# Patient Record
Sex: Male | Born: 1945 | Race: Black or African American | Hispanic: No | Marital: Married
Health system: Southern US, Community
[De-identification: ages and names within clinical notes are randomized; demographics above are authoritative.]

## PROBLEM LIST (undated history)

## (undated) DIAGNOSIS — G309 Alzheimer's disease, unspecified: Secondary | ICD-10-CM

## (undated) DIAGNOSIS — I10 Essential (primary) hypertension: Secondary | ICD-10-CM

## (undated) DIAGNOSIS — E119 Type 2 diabetes mellitus without complications: Secondary | ICD-10-CM

## (undated) DIAGNOSIS — F0280 Dementia in other diseases classified elsewhere without behavioral disturbance: Secondary | ICD-10-CM

## (undated) DIAGNOSIS — F319 Bipolar disorder, unspecified: Secondary | ICD-10-CM

---

## 2000-01-07 ENCOUNTER — Other Ambulatory Visit: Admission: RE | Admit: 2000-01-07 | Discharge: 2000-01-16 | Payer: Self-pay | Admitting: Psychiatry

## 2003-07-03 ENCOUNTER — Encounter: Payer: Self-pay | Admitting: Endocrinology

## 2003-07-03 ENCOUNTER — Ambulatory Visit (HOSPITAL_COMMUNITY): Admission: RE | Admit: 2003-07-03 | Discharge: 2003-07-03 | Payer: Self-pay | Admitting: Endocrinology

## 2004-02-21 ENCOUNTER — Encounter: Admission: RE | Admit: 2004-02-21 | Discharge: 2004-02-21 | Payer: Self-pay | Admitting: *Deleted

## 2004-10-03 ENCOUNTER — Ambulatory Visit: Payer: Self-pay | Admitting: Endocrinology

## 2004-10-31 ENCOUNTER — Ambulatory Visit: Payer: Self-pay | Admitting: Endocrinology

## 2004-11-14 ENCOUNTER — Ambulatory Visit: Payer: Self-pay | Admitting: Endocrinology

## 2004-12-11 ENCOUNTER — Ambulatory Visit: Payer: Self-pay | Admitting: Endocrinology

## 2005-01-15 ENCOUNTER — Ambulatory Visit: Payer: Self-pay | Admitting: Endocrinology

## 2005-02-16 ENCOUNTER — Ambulatory Visit: Payer: Self-pay | Admitting: Endocrinology

## 2006-02-02 ENCOUNTER — Ambulatory Visit: Payer: Self-pay | Admitting: Endocrinology

## 2006-02-10 ENCOUNTER — Ambulatory Visit: Payer: Self-pay | Admitting: Endocrinology

## 2006-02-13 ENCOUNTER — Emergency Department (HOSPITAL_COMMUNITY): Admission: EM | Admit: 2006-02-13 | Discharge: 2006-02-13 | Payer: Self-pay | Admitting: Emergency Medicine

## 2006-03-19 ENCOUNTER — Emergency Department (HOSPITAL_COMMUNITY): Admission: EM | Admit: 2006-03-19 | Discharge: 2006-03-19 | Payer: Self-pay | Admitting: Emergency Medicine

## 2006-04-01 ENCOUNTER — Emergency Department (HOSPITAL_COMMUNITY): Admission: EM | Admit: 2006-04-01 | Discharge: 2006-04-02 | Payer: Self-pay | Admitting: Emergency Medicine

## 2007-01-12 ENCOUNTER — Ambulatory Visit: Payer: Self-pay | Admitting: Endocrinology

## 2007-04-18 ENCOUNTER — Ambulatory Visit: Payer: Self-pay | Admitting: Endocrinology

## 2007-04-18 LAB — CONVERTED CEMR LAB
ALT: 15 units/L (ref 0–53)
AST: 26 units/L (ref 0–37)
Albumin: 3.8 g/dL (ref 3.5–5.2)
Alkaline Phosphatase: 55 units/L (ref 39–117)
BUN: 10 mg/dL (ref 6–23)
Bacteria, UA: NEGATIVE
Basophils Absolute: 0 10*3/uL (ref 0.0–0.1)
Basophils Relative: 0.4 % (ref 0.0–1.0)
Bilirubin Urine: NEGATIVE
Bilirubin, Direct: 0.2 mg/dL (ref 0.0–0.3)
CO2: 29 meq/L (ref 19–32)
Calcium: 9.1 mg/dL (ref 8.4–10.5)
Chloride: 106 meq/L (ref 96–112)
Cholesterol: 172 mg/dL (ref 0–200)
Creatinine, Ser: 0.8 mg/dL (ref 0.4–1.5)
Creatinine,U: 311.8 mg/dL
Crystals: NEGATIVE
Eosinophils Absolute: 0.1 10*3/uL (ref 0.0–0.6)
Eosinophils Relative: 1.3 % (ref 0.0–5.0)
GFR calc Af Amer: 126 mL/min
GFR calc non Af Amer: 104 mL/min
Glucose, Bld: 164 mg/dL — ABNORMAL HIGH (ref 70–99)
HCT: 44.3 % (ref 39.0–52.0)
HDL: 103.9 mg/dL (ref >39.0)
Hemoglobin, Urine: NEGATIVE
Hemoglobin: 15.4 g/dL (ref 13.0–17.0)
Hgb A1c MFr Bld: 6.3 % — ABNORMAL HIGH (ref 4.6–6.0)
Ketones, ur: NEGATIVE mg/dL
LDL Cholesterol: 55 mg/dL (ref 0–99)
Leukocytes, UA: NEGATIVE
Lymphocytes Relative: 27.3 % (ref 12.0–46.0)
MCHC: 34.8 g/dL (ref 30.0–36.0)
MCV: 94.9 fL (ref 78.0–100.0)
Microalb Creat Ratio: 12.8 mg/g (ref 0.0–30.0)
Microalb, Ur: 4 mg/dL — ABNORMAL HIGH (ref 0.0–1.9)
Monocytes Absolute: 0.6 10*3/uL (ref 0.2–0.7)
Monocytes Relative: 8.6 % (ref 3.0–11.0)
Neutro Abs: 4.3 10*3/uL (ref 1.4–7.7)
Neutrophils Relative %: 62.4 % (ref 43.0–77.0)
Nitrite: NEGATIVE
PSA: 0.67 ng/mL (ref 0.10–4.00)
Platelets: 181 10*3/uL (ref 150–400)
Potassium: 3.6 meq/L (ref 3.5–5.1)
RBC / HPF: NONE SEEN
RBC: 4.67 M/uL (ref 4.22–5.81)
RDW: 12 % (ref 11.5–14.6)
Sodium: 143 meq/L (ref 135–145)
Specific Gravity, Urine: 1.025 (ref 1.000–1.03)
TSH: 1.56 microintl units/mL (ref 0.35–5.50)
Testosterone: 266.44 ng/dL — ABNORMAL LOW (ref 350.00–890)
Total Bilirubin: 1 mg/dL (ref 0.3–1.2)
Total CHOL/HDL Ratio: 1.7
Total Protein, Urine: 30 mg/dL — AB
Total Protein: 7.1 g/dL (ref 6.0–8.3)
Triglycerides: 65 mg/dL (ref 0–149)
Urine Glucose: NEGATIVE mg/dL
Urobilinogen, UA: 0.2 (ref 0.0–1.0)
VLDL: 13 mg/dL (ref 0–40)
WBC: 6.9 10*3/uL (ref 4.5–10.5)
pH: 6 (ref 5.0–8.0)

## 2007-04-26 ENCOUNTER — Encounter: Payer: Self-pay | Admitting: Endocrinology

## 2007-04-26 DIAGNOSIS — F3289 Other specified depressive episodes: Secondary | ICD-10-CM | POA: Insufficient documentation

## 2007-04-26 DIAGNOSIS — E119 Type 2 diabetes mellitus without complications: Secondary | ICD-10-CM

## 2007-04-26 DIAGNOSIS — F411 Generalized anxiety disorder: Secondary | ICD-10-CM | POA: Insufficient documentation

## 2007-04-26 DIAGNOSIS — F329 Major depressive disorder, single episode, unspecified: Secondary | ICD-10-CM

## 2007-04-27 ENCOUNTER — Ambulatory Visit: Payer: Self-pay | Admitting: Endocrinology

## 2007-04-29 ENCOUNTER — Ambulatory Visit: Payer: Self-pay | Admitting: Endocrinology

## 2007-05-23 ENCOUNTER — Ambulatory Visit: Payer: Self-pay | Admitting: Endocrinology

## 2007-07-01 ENCOUNTER — Encounter: Payer: Self-pay | Admitting: Endocrinology

## 2007-09-11 ENCOUNTER — Emergency Department (HOSPITAL_COMMUNITY): Admission: EM | Admit: 2007-09-11 | Discharge: 2007-09-11 | Payer: Self-pay | Admitting: Emergency Medicine

## 2007-09-12 ENCOUNTER — Ambulatory Visit: Payer: Self-pay | Admitting: Endocrinology

## 2007-09-12 DIAGNOSIS — IMO0002 Reserved for concepts with insufficient information to code with codable children: Secondary | ICD-10-CM | POA: Insufficient documentation

## 2007-09-12 DIAGNOSIS — I10 Essential (primary) hypertension: Secondary | ICD-10-CM | POA: Insufficient documentation

## 2007-09-12 LAB — CONVERTED CEMR LAB
BUN: 14 mg/dL (ref 6–23)
CO2: 29 meq/L (ref 19–32)
Calcium: 9.2 mg/dL (ref 8.4–10.5)
Chloride: 104 meq/L (ref 96–112)
Creatinine, Ser: 0.9 mg/dL (ref 0.4–1.5)
GFR calc Af Amer: 110 mL/min
GFR calc non Af Amer: 91 mL/min
Glucose, Bld: 147 mg/dL — ABNORMAL HIGH (ref 70–99)
Potassium: 3.8 meq/L (ref 3.5–5.1)
Sodium: 141 meq/L (ref 135–145)

## 2007-09-13 ENCOUNTER — Telehealth (INDEPENDENT_AMBULATORY_CARE_PROVIDER_SITE_OTHER): Payer: Self-pay | Admitting: *Deleted

## 2007-09-14 ENCOUNTER — Encounter: Admission: RE | Admit: 2007-09-14 | Discharge: 2007-09-14 | Payer: Self-pay | Admitting: Endocrinology

## 2007-09-19 ENCOUNTER — Telehealth (INDEPENDENT_AMBULATORY_CARE_PROVIDER_SITE_OTHER): Payer: Self-pay | Admitting: *Deleted

## 2007-09-21 ENCOUNTER — Encounter: Payer: Self-pay | Admitting: Endocrinology

## 2007-09-21 ENCOUNTER — Telehealth (INDEPENDENT_AMBULATORY_CARE_PROVIDER_SITE_OTHER): Payer: Self-pay | Admitting: *Deleted

## 2007-11-02 ENCOUNTER — Telehealth (INDEPENDENT_AMBULATORY_CARE_PROVIDER_SITE_OTHER): Payer: Self-pay | Admitting: *Deleted

## 2007-11-04 ENCOUNTER — Telehealth (INDEPENDENT_AMBULATORY_CARE_PROVIDER_SITE_OTHER): Payer: Self-pay | Admitting: *Deleted

## 2007-11-21 ENCOUNTER — Telehealth (INDEPENDENT_AMBULATORY_CARE_PROVIDER_SITE_OTHER): Payer: Self-pay | Admitting: *Deleted

## 2007-12-27 ENCOUNTER — Ambulatory Visit: Payer: Self-pay | Admitting: Internal Medicine

## 2007-12-27 DIAGNOSIS — J069 Acute upper respiratory infection, unspecified: Secondary | ICD-10-CM | POA: Insufficient documentation

## 2007-12-27 DIAGNOSIS — J019 Acute sinusitis, unspecified: Secondary | ICD-10-CM

## 2008-02-10 ENCOUNTER — Ambulatory Visit: Payer: Self-pay | Admitting: Endocrinology

## 2008-02-24 ENCOUNTER — Encounter: Payer: Self-pay | Admitting: Endocrinology

## 2008-03-01 ENCOUNTER — Encounter: Admission: RE | Admit: 2008-03-01 | Discharge: 2008-03-01 | Payer: Self-pay | Admitting: Neurology

## 2008-03-01 ENCOUNTER — Ambulatory Visit: Payer: Self-pay | Admitting: Endocrinology

## 2008-03-01 DIAGNOSIS — F0390 Unspecified dementia without behavioral disturbance: Secondary | ICD-10-CM

## 2008-03-05 ENCOUNTER — Encounter: Admission: RE | Admit: 2008-03-05 | Discharge: 2008-03-05 | Payer: Self-pay | Admitting: Neurology

## 2008-03-05 ENCOUNTER — Encounter: Payer: Self-pay | Admitting: Endocrinology

## 2008-03-06 ENCOUNTER — Telehealth (INDEPENDENT_AMBULATORY_CARE_PROVIDER_SITE_OTHER): Payer: Self-pay | Admitting: *Deleted

## 2008-03-07 ENCOUNTER — Encounter: Payer: Self-pay | Admitting: Endocrinology

## 2008-04-18 ENCOUNTER — Ambulatory Visit: Payer: Self-pay | Admitting: Endocrinology

## 2008-04-21 LAB — CONVERTED CEMR LAB
ALT: 26 units/L (ref 0–53)
Basophils Absolute: 0.1 10*3/uL (ref 0.0–0.1)
Basophils Relative: 1 % (ref 0.0–3.0)
Bilirubin, Direct: 0.1 mg/dL (ref 0.0–0.3)
Calcium: 9.1 mg/dL (ref 8.4–10.5)
Cholesterol: 156 mg/dL (ref 0–200)
Creatinine, Ser: 0.9 mg/dL (ref 0.4–1.5)
Creatinine,U: 314.9 mg/dL
GFR calc Af Amer: 110 mL/min
Glucose, Bld: 145 mg/dL — ABNORMAL HIGH (ref 70–99)
HCT: 49.2 % (ref 39.0–52.0)
HDL: 87.1 mg/dL (ref >39.0)
Hemoglobin: 17.3 g/dL — ABNORMAL HIGH (ref 13.0–17.0)
Ketones, ur: NEGATIVE mg/dL
MCHC: 35.1 g/dL (ref 30.0–36.0)
Microalb Creat Ratio: 19.4 mg/g (ref 0.0–30.0)
Microalb, Ur: 6.1 mg/dL — ABNORMAL HIGH (ref 0.0–1.9)
Monocytes Absolute: 0.6 10*3/uL (ref 0.1–1.0)
Neutro Abs: 4.3 10*3/uL (ref 1.4–7.7)
RBC: 5.07 M/uL (ref 4.22–5.81)
RDW: 12.1 % (ref 11.5–14.6)
Sodium: 139 meq/L (ref 135–145)
Specific Gravity, Urine: >=1.03 (ref 1.000–1.03)
Testosterone: 786.32 ng/dL (ref 350.00–890)
Total Bilirubin: 0.8 mg/dL (ref 0.3–1.2)
Total Protein: 7.1 g/dL (ref 6.0–8.3)
Triglycerides: 45 mg/dL (ref 0–149)
Urine Glucose: NEGATIVE mg/dL
pH: 5.5 (ref 5.0–8.0)

## 2008-04-26 ENCOUNTER — Ambulatory Visit: Payer: Self-pay | Admitting: Endocrinology

## 2008-04-26 DIAGNOSIS — K769 Liver disease, unspecified: Secondary | ICD-10-CM | POA: Insufficient documentation

## 2008-04-26 DIAGNOSIS — B009 Herpesviral infection, unspecified: Secondary | ICD-10-CM | POA: Insufficient documentation

## 2008-04-26 DIAGNOSIS — E291 Testicular hypofunction: Secondary | ICD-10-CM | POA: Insufficient documentation

## 2008-04-30 ENCOUNTER — Telehealth: Payer: Self-pay | Admitting: Endocrinology

## 2008-08-16 ENCOUNTER — Telehealth (INDEPENDENT_AMBULATORY_CARE_PROVIDER_SITE_OTHER): Payer: Self-pay | Admitting: *Deleted

## 2009-02-22 ENCOUNTER — Ambulatory Visit: Payer: Self-pay | Admitting: Endocrinology

## 2009-02-22 DIAGNOSIS — N476 Balanoposthitis: Secondary | ICD-10-CM | POA: Insufficient documentation

## 2010-10-12 LAB — CONVERTED CEMR LAB: HCV Ab: NEGATIVE

## 2014-04-03 ENCOUNTER — Encounter (HOSPITAL_COMMUNITY): Payer: Self-pay | Admitting: Emergency Medicine

## 2014-04-03 ENCOUNTER — Emergency Department (HOSPITAL_COMMUNITY)
Admission: EM | Admit: 2014-04-03 | Discharge: 2014-04-04 | Disposition: A | Discharge status: Home / Self Care | Payer: Medicare Other | Attending: Emergency Medicine | Admitting: Emergency Medicine

## 2014-04-03 DIAGNOSIS — G20A1 Parkinson's disease without dyskinesia, without mention of fluctuations: Secondary | ICD-10-CM | POA: Insufficient documentation

## 2014-04-03 DIAGNOSIS — Z880 Allergy status to penicillin: Secondary | ICD-10-CM | POA: Diagnosis not present

## 2014-04-03 DIAGNOSIS — F23 Brief psychotic disorder: Secondary | ICD-10-CM | POA: Diagnosis not present

## 2014-04-03 DIAGNOSIS — F411 Generalized anxiety disorder: Secondary | ICD-10-CM | POA: Diagnosis not present

## 2014-04-03 DIAGNOSIS — F22 Delusional disorders: Secondary | ICD-10-CM | POA: Diagnosis not present

## 2014-04-03 DIAGNOSIS — G20 Parkinson's disease: Secondary | ICD-10-CM | POA: Insufficient documentation

## 2014-04-03 DIAGNOSIS — F29 Unspecified psychosis not due to a substance or known physiological condition: Secondary | ICD-10-CM

## 2014-04-03 DIAGNOSIS — Z046 Encounter for general psychiatric examination, requested by authority: Secondary | ICD-10-CM | POA: Diagnosis present

## 2014-04-03 DIAGNOSIS — F28 Other psychotic disorder not due to a substance or known physiological condition: Secondary | ICD-10-CM

## 2014-04-03 HISTORY — DX: Alzheimer's disease, unspecified: G30.9

## 2014-04-03 HISTORY — DX: Essential (primary) hypertension: I10

## 2014-04-03 HISTORY — DX: Dementia in other diseases classified elsewhere, unspecified severity, without behavioral disturbance, psychotic disturbance, mood disturbance, and anxiety: F02.80

## 2014-04-03 HISTORY — DX: Bipolar disorder, unspecified: F31.9

## 2014-04-03 HISTORY — DX: Type 2 diabetes mellitus without complications: E11.9

## 2014-04-03 LAB — URINALYSIS, ROUTINE W REFLEX MICROSCOPIC
Bilirubin Urine: NEGATIVE
Glucose, UA: NEGATIVE mg/dL
HGB URINE DIPSTICK: NEGATIVE
KETONES UR: NEGATIVE mg/dL
Leukocytes, UA: NEGATIVE
Nitrite: NEGATIVE
PROTEIN: NEGATIVE mg/dL
Specific Gravity, Urine: 1.009 (ref 1.005–1.030)
Urobilinogen, UA: 0.2 mg/dL (ref 0.0–1.0)
pH: 7 (ref 5.0–8.0)

## 2014-04-03 LAB — RAPID URINE DRUG SCREEN, HOSP PERFORMED
Amphetamines: NOT DETECTED
Barbiturates: NOT DETECTED
Benzodiazepines: NOT DETECTED
Cocaine: NOT DETECTED
OPIATES: NOT DETECTED
TETRAHYDROCANNABINOL: NOT DETECTED

## 2014-04-03 LAB — COMPREHENSIVE METABOLIC PANEL
ALT: 15 U/L (ref 0–53)
AST: 31 U/L (ref 0–37)
Albumin: 3.9 g/dL (ref 3.5–5.2)
Alkaline Phosphatase: 79 U/L (ref 39–117)
Anion gap: 14 (ref 5–15)
BUN: 6 mg/dL (ref 6–23)
CALCIUM: 9.3 mg/dL (ref 8.4–10.5)
CO2: 22 meq/L (ref 19–32)
CREATININE: 0.75 mg/dL (ref 0.50–1.35)
Chloride: 101 mEq/L (ref 96–112)
GFR calc Af Amer: >90 mL/min (ref >90)
GFR calc non Af Amer: >90 mL/min (ref >90)
Glucose, Bld: 134 mg/dL — ABNORMAL HIGH (ref 70–99)
Potassium: 3.7 mEq/L (ref 3.7–5.3)
Sodium: 137 mEq/L (ref 137–147)
Total Bilirubin: 0.6 mg/dL (ref 0.3–1.2)
Total Protein: 7.3 g/dL (ref 6.0–8.3)

## 2014-04-03 LAB — CBC WITH DIFFERENTIAL/PLATELET
Basophils Absolute: 0 10*3/uL (ref 0.0–0.1)
Basophils Relative: 1 % (ref 0–1)
EOS PCT: 1 % (ref 0–5)
Eosinophils Absolute: 0 10*3/uL (ref 0.0–0.7)
HEMATOCRIT: 43.3 % (ref 39.0–52.0)
Hemoglobin: 15.4 g/dL (ref 13.0–17.0)
LYMPHS ABS: 2.1 10*3/uL (ref 0.7–4.0)
LYMPHS PCT: 26 % (ref 12–46)
MCH: 31.6 pg (ref 26.0–34.0)
MCHC: 35.6 g/dL (ref 30.0–36.0)
MCV: 88.9 fL (ref 78.0–100.0)
MONO ABS: 0.4 10*3/uL (ref 0.1–1.0)
MONOS PCT: 6 % (ref 3–12)
Neutro Abs: 5.3 10*3/uL (ref 1.7–7.7)
Neutrophils Relative %: 66 % (ref 43–77)
Platelets: 192 10*3/uL (ref 150–400)
RBC: 4.87 MIL/uL (ref 4.22–5.81)
RDW: 11.6 % (ref 11.5–15.5)
WBC: 7.9 10*3/uL (ref 4.0–10.5)

## 2014-04-03 LAB — ETHANOL

## 2014-04-03 MED ORDER — ACETAMINOPHEN 325 MG PO TABS: 650.0000 mg | ORAL_TABLET | ORAL | Status: DC | PRN

## 2014-04-03 MED ORDER — IBUPROFEN 200 MG PO TABS: 600.0000 mg | ORAL_TABLET | Freq: Three times a day (TID) | ORAL | Status: DC | PRN

## 2014-04-03 MED ORDER — ONDANSETRON HCL 4 MG PO TABS: 4.0000 mg | ORAL_TABLET | Freq: Three times a day (TID) | ORAL | Status: DC | PRN

## 2014-04-03 MED ORDER — LORAZEPAM 1 MG PO TABS
1.0000 mg | ORAL_TABLET | Freq: Three times a day (TID) | ORAL | Status: DC | PRN
  Administered 2014-04-03 – 2014-04-04 (×2): 1 mg via ORAL
  Filled 2014-04-03 (×2): qty 1

## 2014-04-03 MED ORDER — ALUM & MAG HYDROXIDE-SIMETH 200-200-20 MG/5ML PO SUSP: 30.0000 mL | ORAL | Status: DC | PRN

## 2014-04-03 NOTE — ED Notes (Addendum)
Pt changed into blue paper scrubs.  Pt has black shorts, black boxer/briefs, red collar t-shirt, black short sleeve shirt. Items placed in belonging bag at yellow side nurse's desk, waiting for security to wand pt and belongings.

## 2014-04-03 NOTE — Consult Note (Signed)
[facility] Consult   Reason for Consult:  Paranoid thinking Referring Physician:  ER MD  Todd Howe is an 68 y.o. male. Total Time spent with patient: 45 minutes  Assessment: AXIS I:  Psychotic Disorder NOS AXIS II:  Deferred AXIS III:   Past Medical History  Diagnosis Date  . Bipolar affective   . Alzheimer disease   . Hypertension   . Diabetes mellitus without complication     resolved, not currently being treated as of 02/2014   AXIS IV:  other psychosocial or environmental problems AXIS V:  51-60 moderate symptoms  Plan:  Recommend psychiatric Inpatient admission when medically cleared.  Subjective:   Todd Howe is a 68 y.o. male patient admitted with paranoid thinking .  HPI:  Todd Howe says he is fine and there are no problems.  His daughter reported he has been paranoid recently thinking people are trying to kill him.  He had a similar episode 3 years ago.  When he gets this way he isolates himself from his family as he is doing now.  He is in the process of getting a divorce and moved here from [place] in May.  He does believe people are trying to kill him including his wife he reported.  He was at the police station yesterday saying his computer had been hacked and that people were trying to kill him..  Today he was apparently lying on the floor of a business because he was tired.  He is talkative and does not display any dementia symptoms at this point. HPI Elements:   Location:  paranoid thinking. Quality:  believes people are trying to kill him. Severity:  as above. Timing:  in the process of getting a divorce he does not want. Duration:  week or so. Context:  as above.  Past Psychiatric History: Past Medical History  Diagnosis Date  . Bipolar affective   . Alzheimer disease   . Hypertension   . Diabetes mellitus without complication     resolved, not currently being treated as of 02/2014    reports that he does not drink alcohol or use  illicit drugs. His tobacco history is not on file. No family history on file.         Allergies:   Allergies  Allergen Reactions  . Penicillins     REACTION: unspecified    ACT Assessment Complete:  Yes:    Educational Status    Risk to Self: Risk to self Is patient at risk for suicide?: No Substance abuse history and/or treatment for substance abuse?: No  Risk to Others:    Abuse:    Prior Inpatient Therapy:    Prior Outpatient Therapy:    Additional Information:                    Objective: Blood pressure 137/91, pulse 78, temperature 98.1 F (36.7 C), temperature source Oral, resp. rate 16, SpO2 98.00%.There is no weight on file to calculate BMI. Results for orders placed during the hospital encounter of 04/03/14 (from the past 72 hour(s))  CBC WITH DIFFERENTIAL     Status: None   Collection Time    04/03/14 11:42 AM      Result Value Ref Range   WBC 7.9  4.0 - 10.5 K/uL   RBC 4.87  4.22 - 5.81 MIL/uL   Hemoglobin 15.4  13.0 - 17.0 g/dL   HCT 43.3  39.0 - 52.0 %   MCV 88.9  78.0 - 100.0 fL   MCH 31.6  26.0 - 34.0 pg   MCHC 35.6  30.0 - 36.0 g/dL   RDW 11.6  11.5 - 15.5 %   Platelets 192  150 - 400 K/uL   Neutrophils Relative % 66  43 - 77 %   Neutro Abs 5.3  1.7 - 7.7 K/uL   Lymphocytes Relative 26  12 - 46 %   Lymphs Abs 2.1  0.7 - 4.0 K/uL   Monocytes Relative 6  3 - 12 %   Monocytes Absolute 0.4  0.1 - 1.0 K/uL   Eosinophils Relative 1  0 - 5 %   Eosinophils Absolute 0.0  0.0 - 0.7 K/uL   Basophils Relative 1  0 - 1 %   Basophils Absolute 0.0  0.0 - 0.1 K/uL  COMPREHENSIVE METABOLIC PANEL     Status: Abnormal   Collection Time    04/03/14 11:42 AM      Result Value Ref Range   Sodium 137  137 - 147 mEq/L   Potassium 3.7  3.7 - 5.3 mEq/L   Chloride 101  96 - 112 mEq/L   CO2 22  19 - 32 mEq/L   Glucose, Bld 134 (*) 70 - 99 mg/dL   BUN 6  6 - 23 mg/dL   Creatinine, Ser 0.75  0.50 - 1.35 mg/dL   Calcium 9.3  8.4 - 10.5 mg/dL   Total  Protein 7.3  6.0 - 8.3 g/dL   Albumin 3.9  3.5 - 5.2 g/dL   AST 31  0 - 37 U/L   ALT 15  0 - 53 U/L   Alkaline Phosphatase 79  39 - 117 U/L   Total Bilirubin 0.6  0.3 - 1.2 mg/dL   GFR calc non Af Amer >90  >90 mL/min   GFR calc Af Amer >90  >90 mL/min   Comment: (NOTE)     The eGFR has been calculated using the CKD EPI equation.     This calculation has not been validated in all clinical situations.     eGFR's persistently <90 mL/min signify possible Chronic Kidney     Disease.   Anion gap 14  5 - 15  ETHANOL     Status: None   Collection Time    04/03/14 11:42 AM      Result Value Ref Range   Alcohol, Ethyl (B) <11  0 - 11 mg/dL   Comment:            LOWEST DETECTABLE LIMIT FOR     SERUM ALCOHOL IS 11 mg/dL     FOR MEDICAL PURPOSES ONLY  URINALYSIS, ROUTINE W REFLEX MICROSCOPIC     Status: None   Collection Time    04/03/14 12:58 PM      Result Value Ref Range   Color, Urine YELLOW  YELLOW   APPearance CLEAR  CLEAR   Specific Gravity, Urine 1.009  1.005 - 1.030   pH 7.0  5.0 - 8.0   Glucose, UA NEGATIVE  NEGATIVE mg/dL   Hgb urine dipstick NEGATIVE  NEGATIVE   Bilirubin Urine NEGATIVE  NEGATIVE   Ketones, ur NEGATIVE  NEGATIVE mg/dL   Protein, ur NEGATIVE  NEGATIVE mg/dL   Urobilinogen, UA 0.2  0.0 - 1.0 mg/dL   Nitrite NEGATIVE  NEGATIVE   Leukocytes, UA NEGATIVE  NEGATIVE   Comment: MICROSCOPIC NOT DONE ON URINES WITH NEGATIVE PROTEIN, BLOOD, LEUKOCYTES, NITRITE, OR GLUCOSE <1000 mg/dL.  URINE RAPID DRUG  SCREEN (HOSP PERFORMED)     Status: None   Collection Time    04/03/14 12:58 PM      Result Value Ref Range   Opiates NONE DETECTED  NONE DETECTED   Cocaine NONE DETECTED  NONE DETECTED   Benzodiazepines NONE DETECTED  NONE DETECTED   Amphetamines NONE DETECTED  NONE DETECTED   Tetrahydrocannabinol NONE DETECTED  NONE DETECTED   Barbiturates NONE DETECTED  NONE DETECTED   Comment:            DRUG SCREEN FOR MEDICAL PURPOSES     ONLY.  IF CONFIRMATION IS NEEDED      FOR ANY PURPOSE, NOTIFY LAB     WITHIN 5 DAYS.                LOWEST DETECTABLE LIMITS     FOR URINE DRUG SCREEN     Drug Class       Cutoff (ng/mL)     Amphetamine      1000     Barbiturate      200     Benzodiazepine   952     Tricyclics       841     Opiates          300     Cocaine          300     THC              50   Labs are reviewed and are pertinent for no psychiatric issues.  No current facility-administered medications for this encounter.   Current Outpatient Prescriptions  Medication Sig Dispense Refill  . amLODipine (NORVASC) 5 MG tablet Take 5 mg by mouth daily.      Marland Kitchen aspirin EC 81 MG tablet Take 162 mg by mouth daily.      . Cholecalciferol (VITAMIN D3) 5000 UNITS CAPS Take 5,000 Units by mouth daily.      . clobetasol cream (TEMOVATE) 3.24 % Apply 1 application topically 2 (two) times daily as needed (rash).      . metFORMIN (GLUCOPHAGE) 500 MG tablet Take 500 mg by mouth 2 (two) times daily with a meal.      . Omega-3 Fatty Acids (FISH OIL) 1000 MG CAPS Take 2,000 mg by mouth daily.      Marland Kitchen testosterone (ANDROGEL) 50 MG/5GM (1%) GEL Place 5 g onto the skin daily.      . traZODone (DESYREL) 150 MG tablet Take 150 mg by mouth at bedtime as needed for sleep.      . vitamin B-12 (CYANOCOBALAMIN) 500 MCG tablet Take 500 mcg by mouth daily.      Marland Kitchen ketoconazole (NIZORAL) 2 % shampoo Apply 1 application topically 2 (two) times a week.      . triamcinolone cream (KENALOG) 0.1 % Apply 1 application topically 2 (two) times daily as needed (rash).        Psychiatric Specialty Exam:     Blood pressure 137/91, pulse 78, temperature 98.1 F (36.7 C), temperature source Oral, resp. rate 16, SpO2 98.00%.There is no weight on file to calculate BMI.  General Appearance: Well Groomed  Engineer, water::  Good  Speech:  Clear and Coherent  Volume:  Normal  Mood:  Euthymic  Affect:  Appropriate  Thought Process:  Coherent  Orientation:  Full (Time, Place, and Person)   Thought Content:  denies paranoid thoughts but does believe people are trying to kill him  Suicidal Thoughts:  No  Homicidal Thoughts:  No  Memory:  Immediate;   Good Recent;   Good Remote;   Good  Judgement:  Impaired  Insight:  Shallow  Psychomotor Activity:  Normal  Concentration:  Good  Recall:  Good  Fund of Knowledge:Good  Language: Good  Akathisia:  Negative  Handed:  Right  AIMS (if indicated):     Assets:  Communication Skills Housing Physical Health Social Support Transportation  Sleep:      Musculoskeletal: Strength & Muscle Tone: within normal limits Gait & Station: normal Patient leans: N/A  Treatment Plan Summary: Daily contact with patient to assess and evaluate symptoms and progress in treatment Medication management Seek inpatient bed for treatment of psychosis  Elston Aldape D 04/03/2014 3:17 PM

## 2014-04-03 NOTE — ED Notes (Signed)
Pt constantly standing near the door and looking out.  On occasion, pt will open door and yell in the hallway or bother other pt's.  Pt has been re-oriented back to his room multiple times. Dr. Criss AlvineGoldston made aware of his behavior and 1mg  PO ativan given to pt as reflected in the eMAR.

## 2014-04-03 NOTE — ED Notes (Signed)
Per Thayer Ohmhris and Tracy/children pt went to [employer] [facility] stating people trying to track his phone and computer. Pt not himself and off of medications. Pt appears agitated with family. Family in waiting room at present time.  PA and MD aware of ALL.

## 2014-04-03 NOTE — ED Notes (Signed)
Security at bedside wanding pt 

## 2014-04-03 NOTE — ED Notes (Signed)
TTS at bedside. 

## 2014-04-03 NOTE — BH Assessment (Signed)
[facility] Assessment Progress Note  [facility]. [facility] by Morrie SheldonAshley due to history of dementia in medical history.  [facility] no [facility].  TTS will continue seeking placement.

## 2014-04-03 NOTE — ED Notes (Signed)
Bed: [id number] Expected date: 04/03/14 Expected time: 10:47 AM Means of arrival:  Comments: ems psych hx male

## 2014-04-03 NOTE — Consult Note (Signed)
  Review of Systems  Constitutional: Negative.   HENT: Negative.   Eyes: Negative.   Respiratory: Negative.   Cardiovascular: Negative.   Gastrointestinal: Negative.   Genitourinary: Negative.   Musculoskeletal: Negative.   Skin: Negative.   Neurological: Negative.   Endo/Heme/Allergies: Negative.   Psychiatric/Behavioral: Negative.    Has paranoid thinking

## 2014-04-03 NOTE — ED Notes (Addendum)
Upon assessment pt reports he is Todd Howe. Pt unable to state birthday. Pt aware [employer] brought him here but unable to state why. Pt states does not feel like he slept and believes has been driving all night. Pt later states he is 68 years old. Pt reports it is Tuesday July 21st.   Pt denies SI/HI.

## 2014-04-03 NOTE — ED Notes (Addendum)
Black shorts, black underwear, red collared shirt, black shirt, black crocs, 2 verizon samsung cell phone, 4 one dollar bills, 1 fiver dollar bill, metal cross, keys, pepper spray, black comb, 3 white mints, and 1 cough drop IN ONE PT BELONGINGS BAG in LOCKER 26.

## 2014-04-03 NOTE — ED Notes (Addendum)
Todd Howe/friend of pt here at bedside and bought emergency list written by pt. Per Todd Howe from emergency list reports [facility] [address] [place] [place] with early stages of Alzheimer's dementia. Information from Todd Howe reports by [employer] who brought pt here.  Todd Howe/like a son/close friend [phone] Todd Howe/sister [phone] [facility]/friend [phone]

## 2014-04-03 NOTE — ED Notes (Signed)
Pt given ice water and saline crackers.

## 2014-04-03 NOTE — ED Notes (Addendum)
Pt reports correct name and birth date at present time. Pt reports knows he was calling himself Jill SideJudge David Right to tell a good story. Pt reports this morning was going to gym and was "redirected" and that "being redirected sometimes saves your life."

## 2014-04-03 NOTE — ED Notes (Addendum)
Per EMS pt at business and reports he was tired. Pt continued by laying on the floor. [employer] arrived on scene and pt transported here via EMS for psychiatric evaluation. Pt reports to EMS that he had a doctor appointment today.  EMS reports POA states extensive psych medical hx.

## 2014-04-03 NOTE — ED Notes (Signed)
Pt reports birth date of 06/03/2014. Pt states is Jill SideJudge David Right.

## 2014-04-03 NOTE — ED Provider Notes (Signed)
Medical screening examination/treatment/procedure(s) were conducted as a shared visit with non-physician practitioner(s) and myself.  I personally evaluated the patient during the encounter.   EKG Interpretation None      Pt is a 68 y.o. male with history of hypertension, diabetes, Alzheimer's dementia, bipolar disorder who presents to the emergency department in police custody for concerns for psychosis. Per police and EMS, patient was at a place of business today when he laid on the floor because he states he is tired. He initially told staff in the emergency department that his name was Heather RobertsJudge David Wright and he was disoriented to time.  When I evaluated the patient, he is oriented x3. When I asked him why he keeps them a different name, he states "because I was trying to act crazy because people are trying to kill me.". When asked who is training to him, patient states the people that should be in jail, they are trying to poison me." Patient denies any SI or HI. Denies drug or alcohol use. Patient's friend is at bedside he reports that he has been acting abnormally for the past 2 days and he does not think he is taking his medications. Patient appears psychotic, paranoid but is cooperative. We'll obtain screening labs and urine and consult psychiatry for evaluation.  Layla MawKristen N Abdelrahman Nair, DO 04/04/14 [phone]

## 2014-04-03 NOTE — ED Notes (Signed)
PA at bedside.

## 2014-04-03 NOTE — ED Provider Notes (Signed)
CSN: [id number]     Arrival date & time 04/03/14  1057 History   First MD Initiated Contact with Patient 04/03/14 1102     Chief Complaint  Patient presents with  . Psychiatric Evaluation     (Consider location/radiation/quality/duration/timing/severity/associated sxs/prior Treatment) HPI Comments: Patient brought in today by [employer] due to concern for Psychosis.  He apparently went to a place of business today and was lying on the ground.  [employer] was called and brought the patient to the ED.  Upon arrival in the ED, patient told Nursing Staff that he was Windy CannyJudge Wright.  He then told staff that his daughter is trying to poison him.  He reports that several people are trying to kill him.  He also reports that people are trying to track his phone.  Family report that the patient has a history of Bipolar and has been off of his medication for a couple of days.  Family also reports that the patient has a history of Early Alzheimers.  Patient denies SI or HI.  He denies any recent alcohol or recreational drug use.  He denies any physical symptoms at this time.  The history is provided by the patient.    No past medical history on file. No past surgical history on file. No family history on file. History  Substance Use Topics  . Smoking status: Not on file  . Smokeless tobacco: Not on file  . Alcohol Use: Not on file    Review of Systems  All other systems reviewed and are negative.     Allergies  Penicillins  Home Medications   Prior to Admission medications   Not on File   BP 150/95  Pulse 82  Temp(Src) 98.1 F (36.7 C) (Oral)  Resp 16  SpO2 99% Physical Exam  Nursing note reviewed. Constitutional: He appears well-developed and well-nourished.  HENT:  Head: Normocephalic and atraumatic.  Mouth/Throat: Oropharynx is clear and moist.  Eyes: EOM are normal. Pupils are equal, round, and reactive to light.  Neck: Normal range of motion. Neck supple.  Cardiovascular: Normal rate,  regular rhythm and normal heart sounds.   Pulmonary/Chest: Effort normal and breath sounds normal.  Musculoskeletal: Normal range of motion.  Neurological: He is alert. He has normal strength. No cranial nerve deficit or sensory deficit. Gait normal.  Skin: Skin is warm and dry.  Psychiatric: His mood appears anxious. His speech is tangential. Thought content is paranoid. He expresses no homicidal and no suicidal ideation. He expresses no suicidal plans and no homicidal plans.    ED Course  Procedures (including critical care time) Labs Review Labs Reviewed  CBC WITH DIFFERENTIAL  COMPREHENSIVE METABOLIC PANEL  URINALYSIS, ROUTINE W REFLEX MICROSCOPIC  URINE RAPID DRUG SCREEN (HOSP PERFORMED)  ETHANOL    Imaging Review No results found.   EKG Interpretation None      MDM   Final diagnoses:  None   Patient with a history of Bipolar brought in today by [employer] due to concern for Psychosis.  Family report that the patient has a history of the same and has not been taking his Psychiatric Medications for the past few days.  Patient appears to be Paranoid, but is cooperative.  Labs and UA unremarkable.  TTS consulted.  Placement pending.      Santiago GladHeather Keene Gilkey, PA-C 04/04/14 2236

## 2014-04-03 NOTE — ED Notes (Addendum)
Tonya/daughter called here and reports yesterday pt/father reports to close friend Michael/shierff "not making sense and people after room." Per daughter pt has not been taking his dementia medication and acted like this 3 years ago. At that time,pt was admitted for "chemical imbalance" but daughter unable to state due to what. Per daughter Thayer OhmChris and Tracy/other children on way.   Archie Pattenonya [phone]  Past medical hx per Tonya: high blood pressure, diabetes resolved currently not treated, bipolar, and dementia.

## 2014-04-04 ENCOUNTER — Emergency Department (HOSPITAL_COMMUNITY): Payer: Medicare Other

## 2014-04-04 LAB — CBG MONITORING, ED: GLUCOSE-CAPILLARY: 147 mg/dL — AB (ref 70–99)

## 2014-04-04 MED ORDER — OLANZAPINE 10 MG PO TBDP
10.0000 mg | ORAL_TABLET | Freq: Once | ORAL | Status: AC
  Administered 2014-04-04: 10 mg via ORAL
  Filled 2014-04-04: qty 1

## 2014-04-04 MED ORDER — OLANZAPINE 10 MG PO TBDP: 10.0000 mg | ORAL_TABLET | Freq: Every day | ORAL | Status: DC

## 2014-04-04 NOTE — ED Notes (Signed)
Pt given a pen and paper.

## 2014-04-04 NOTE — ED Notes (Addendum)
[facility], [facility]. Pt unable to sign for discharge s/t delusions.

## 2014-04-04 NOTE — Consult Note (Signed)
  Psychiatric Specialty Exam: Physical Exam  ROS  Blood pressure 140/82, pulse 79, temperature 97.5 F (36.4 C), temperature source Oral, resp. rate 16, SpO2 100.00%.There is no weight on file to calculate BMI.  General Appearance: Well Groomed  Patent attorneyye Contact::  Good  Speech:  rapid but not pressured  Volume:  Normal  Mood:  Euphoric  Affect:  Labile  Thought Process:  Coherent and Irrelevant  Orientation:  Full (Time, Place, and Person)  Thought Content:  Negative  Suicidal Thoughts:  No  Homicidal Thoughts:  No  Memory:  Immediate;   Good Recent;   Good Remote;   Good  Judgement:  Poor  Insight:  Shallow  Psychomotor Activity:  Normal  Concentration:  Good  Recall:  Good  Akathisia:  Negative  Handed:  Right  AIMS (if indicated):     Assets:  ArchitectCommunication Skills Financial Resources/Insurance Housing Physical Health  Sleep:   adequate  Todd Howe. Vague explanations for his behavior, for example when asked why he was lying on the floor of a business he said, "the police said I was, so I was". The nurse recorded a note he wrote threatening to kill others with other cryptic messages.  The plan is to seek inpatient treatment for bipolar disorder. He does not want to be inpatient but wants to stay in the ER for awhile because he feels safe from the people who he believes are trying to kill him.  He is annoying the other patients with his intrusive behavior, his nurse reported and will be given Zyprexa to see if that helps.

## 2014-04-04 NOTE — ED Provider Notes (Signed)
Pt brought to the ED for psychosis. Awaiting placement.  Standing up in his room, smiling without complaints.  Filed Vitals:   04/04/14 0908  BP: 140/82  Pulse: 79  Temp:   Resp: 16   Pending placement   Todd DibblesJon Beata Beason, MD 04/04/14 [phone]

## 2014-04-04 NOTE — Consult Note (Signed)
Review of Systems   Constitutional: Negative.    HENT: Negative.    Eyes: Negative.    Respiratory: Negative.    Cardiovascular: Negative.    Gastrointestinal: Negative.    Genitourinary: Negative.    Musculoskeletal: Negative.    Skin: Negative.    Neurological: Negative.    Endo/Heme/Allergies: Negative.    Psychiatric/Behavioral: Negative.

## 2014-04-04 NOTE — BH Assessment (Signed)
[facility] Assessment Progress Note  [facility] Dr. Jeannine KittenFarah per Albin Fellingarla.. Can be transported by police after 4:30.  Call report # is [phone].  Faxed demographic sheet with SS#.

## 2014-04-04 NOTE — ED Notes (Addendum)
Pt has hand-written the following on paper:   "Look Alike, Act Alike  A. Pay the Prices B. Rep the rewards C. Walk Out With Staplesracy.  The The First Daugher Only one Saved by -- [employer] [facility].  Motive - Greed REVENGE  Plan While in Prison ---------- Have Roetta SessionsJohn William Danielski SR, To Shannan HarperKill his entire family including his son - in Social workerlaw [place]. Why Sherrie Sportlarence, Mohannad Hated Clarence Because CHRIS felt that he was the Cause of his initial Incarceration. Darlin Drophristopher Layman Granade"

## 2014-04-04 NOTE — BH Assessment (Signed)
[facility] Assessment Progress Note  At 13:00 I received a call from TracyDana at [facility].  She reports that they will not have a bed available today, but are willing to accept pt once one is available.  However, they will need the following items:  *Findings and Custody Order *An EKG *A chest x-ray  After receiving them and a bed becomes available they will call with the accepting physician's name and other details.  At 13:03 I called Barrington EllisonEmily Hull, TS at the [facility] to notify her.  She agrees to obtain and send this information.  Doylene Canninghomas Laboy, MA Triage Specialist 04/04/2014 @ 13:06

## 2014-04-04 NOTE — Progress Notes (Signed)
Attempted to secure placement and faxed the following facilities:  [facility]- Victorino DikeJennifer- faxed information [facility]- Margarita RanaMarsia- faxed information [facility]- no answer [facility]- [facility]- faxed information [facility]- [facility]- faxed information [facility]- no answer [facility]- Carol- no beds [facility]- [facility] beds [facility]- Trey PaulaJeff- fax to the wait list [facility]- Dara LordsLesha- faxed information [facility]- Robin- no beds [facility]- Marylu LundJanet- faxed information [facility]- [facility]- only Child/adol [facility]- Dorathy DaftKayla- faxed information [facility]- Terry- faxed information [facility]- left message [facility]- [facility] message [facility]- Melody- [facility] beds [facility]- Maryann- [facility] beds [facility]- Christine- only [facility]/adol [facility]- Sharon- faxed information [facility]- faxed information    Maryelizabeth Rowanressa Airel Magadan, MSW, ElversonLCSWA, 04/04/2014 Evening Clinical Social Worker [phone]

## 2014-04-04 NOTE — ED Notes (Signed)
[facility] received [facility] on patent and they are at full capacity

## 2014-04-04 NOTE — ED Notes (Signed)
pts children  Todd Howe, daughter, [phone], lives in [place] Howe, son, [phone] Todd Howe, daughter, [phone]

## 2014-04-04 NOTE — BH Assessment (Signed)
[facility] Assessment Progress Note  At 16:42 I received a call from Trixie DredgeKaren Hassall at [facility].  Pt has been declined by their facility due to lack of appropriate programming for pt's with Alzheimer's dementia.  Doylene Canninghomas Confer, MA Triage Specialist 04/04/2014 @ 16:49

## 2015-01-24 IMAGING — CR DG CHEST 1V
1 series · 2 of 2 positions shown · non-contrast
Comparison: None.

CLINICAL DATA: Psychiatric evaluation.

EXAM:
CHEST - 1 VIEW

[Series 1: AP · U · 2 of 2 slices shown]
[im 1/2]
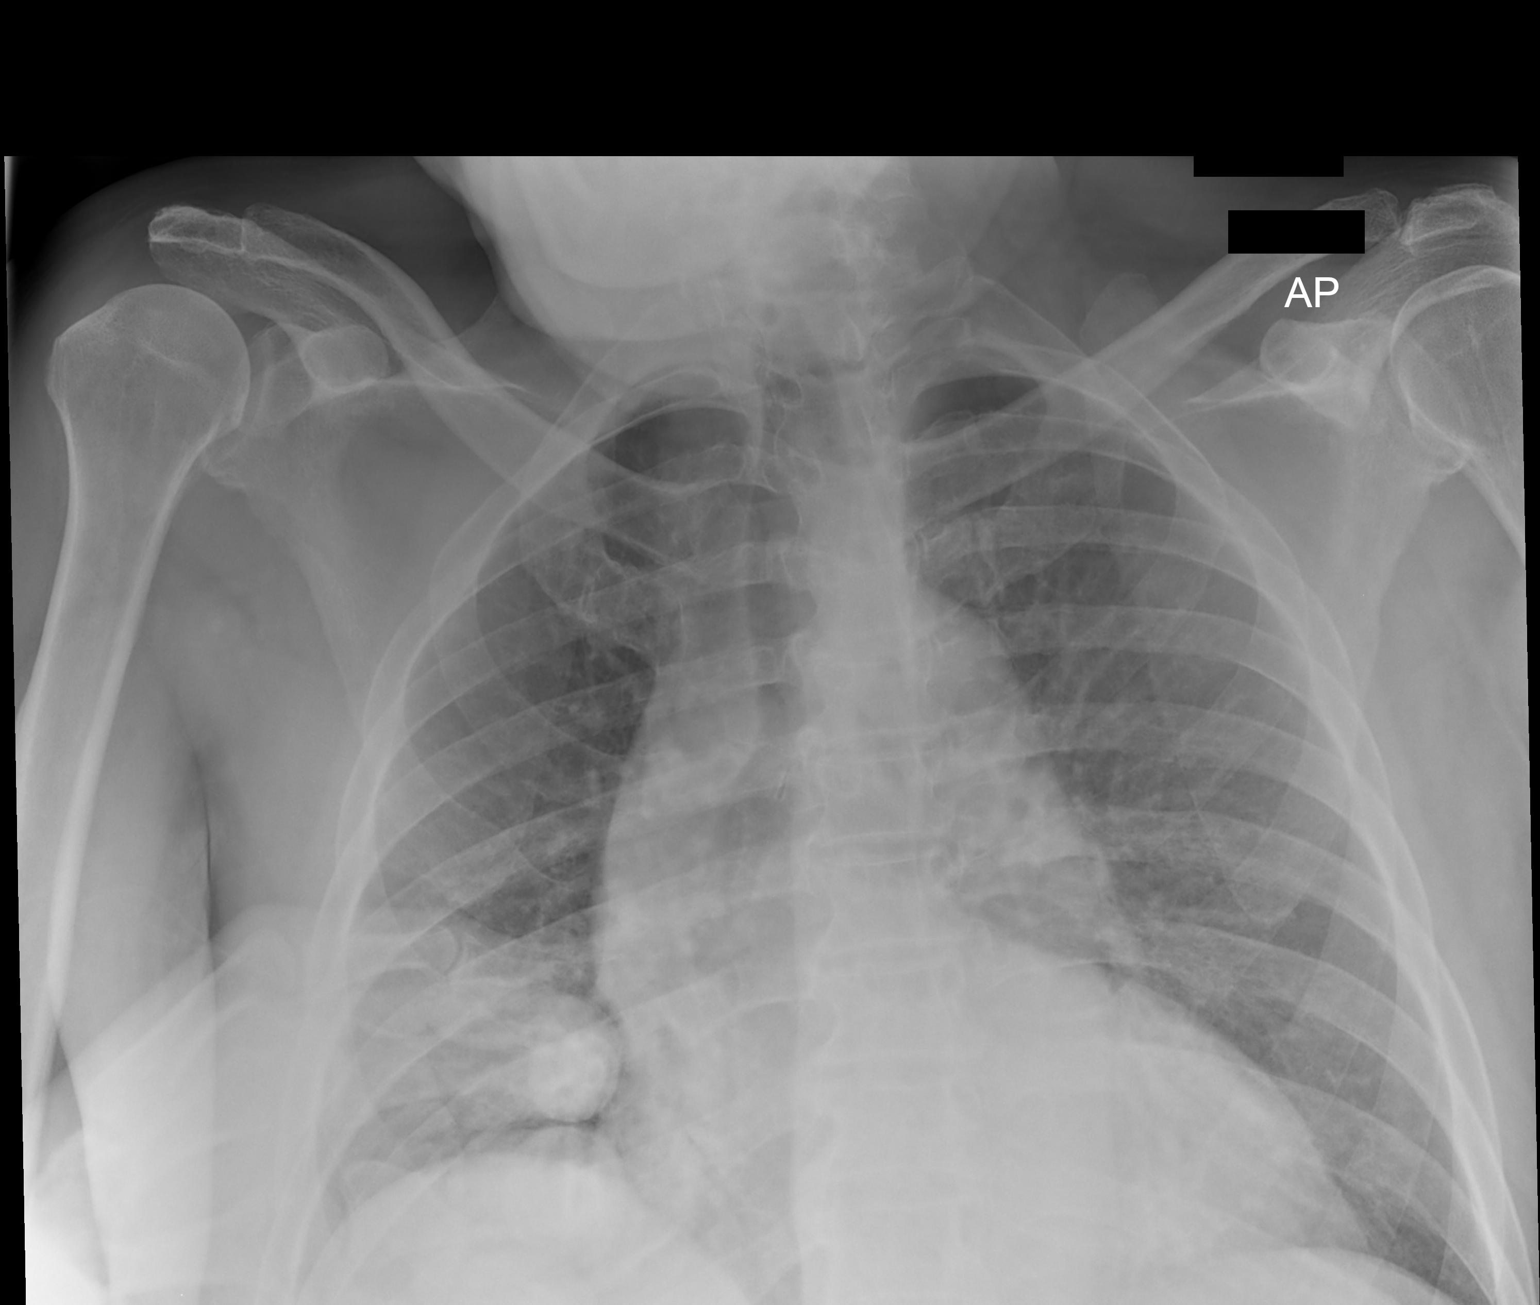
[im 2/2]
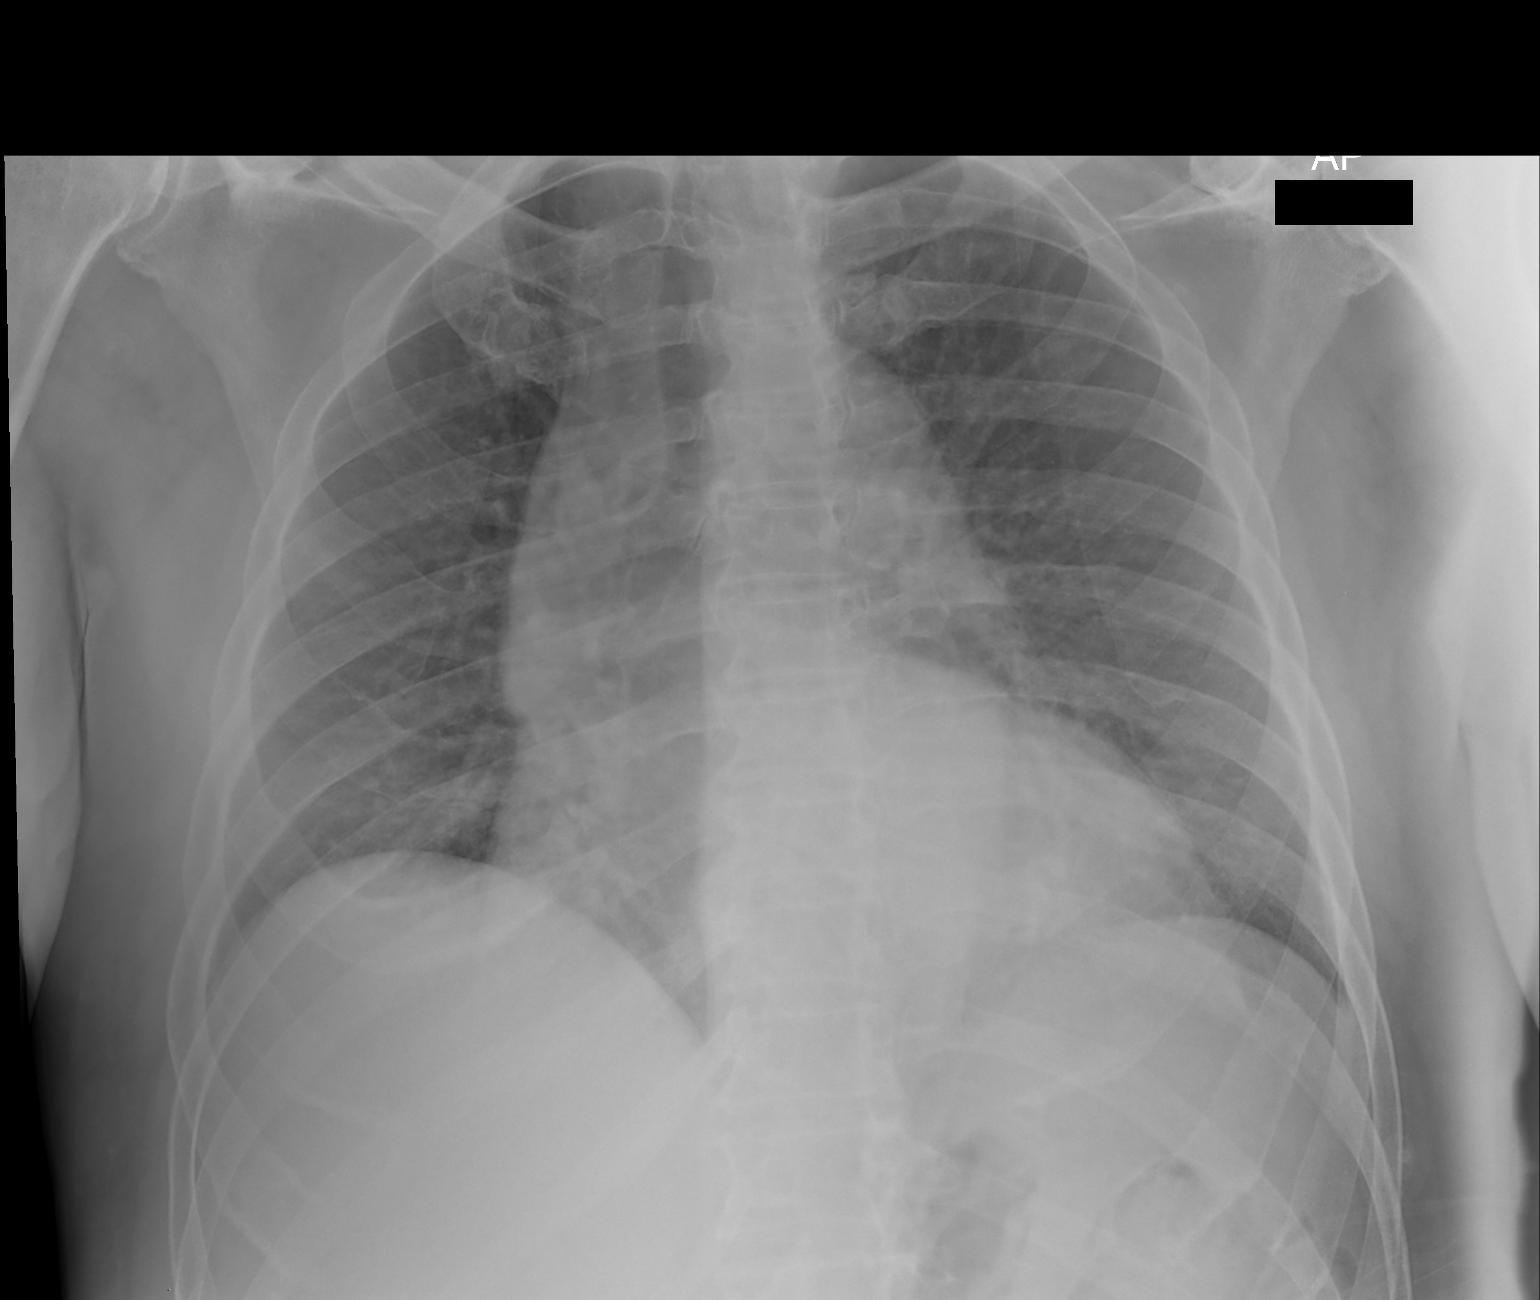

[2 of 2 positions shown; findings below may reference images not displayed]

FINDINGS: There is cardiomegaly without edema. The aorta is tortuous. Lungs
are clear. No pneumothorax or pleural effusion.
IMPRESSION: Cardiomegaly without acute disease.
